# Patient Record
Sex: Female | Born: 1950
Health system: Southern US, Community
[De-identification: ages and names within clinical notes are randomized; demographics above are authoritative.]

## PROBLEM LIST (undated history)

## (undated) DIAGNOSIS — Z9889 Other specified postprocedural states: Secondary | ICD-10-CM

## (undated) DIAGNOSIS — E785 Hyperlipidemia, unspecified: Secondary | ICD-10-CM

## (undated) DIAGNOSIS — R112 Nausea with vomiting, unspecified: Secondary | ICD-10-CM

## (undated) DIAGNOSIS — K589 Irritable bowel syndrome without diarrhea: Secondary | ICD-10-CM

## (undated) DIAGNOSIS — T7840XA Allergy, unspecified, initial encounter: Secondary | ICD-10-CM

## (undated) DIAGNOSIS — I10 Essential (primary) hypertension: Secondary | ICD-10-CM

## (undated) DIAGNOSIS — M1990 Unspecified osteoarthritis, unspecified site: Secondary | ICD-10-CM

## (undated) DIAGNOSIS — F419 Anxiety disorder, unspecified: Secondary | ICD-10-CM

## (undated) DIAGNOSIS — M8580 Other specified disorders of bone density and structure, unspecified site: Secondary | ICD-10-CM

## (undated) DIAGNOSIS — F32A Depression, unspecified: Secondary | ICD-10-CM

## (undated) HISTORY — DX: Anxiety disorder, unspecified: F41.9

## (undated) HISTORY — DX: Essential (primary) hypertension: I10

## (undated) HISTORY — DX: Unspecified osteoarthritis, unspecified site: M19.90

## (undated) HISTORY — DX: Other specified disorders of bone density and structure, unspecified site: M85.80

## (undated) HISTORY — PX: HAND SURGERY: SHX662

## (undated) HISTORY — DX: Other specified postprocedural states: Z98.890

## (undated) HISTORY — DX: Hyperlipidemia, unspecified: E78.5

## (undated) HISTORY — PX: POLYPECTOMY: SHX149

## (undated) HISTORY — PX: COLONOSCOPY: SHX174

## (undated) HISTORY — DX: Irritable bowel syndrome, unspecified: K58.9

## (undated) HISTORY — DX: Nausea with vomiting, unspecified: R11.2

## (undated) HISTORY — DX: Depression, unspecified: F32.A

## (undated) HISTORY — PX: TONSILLECTOMY: SUR1361

## (undated) HISTORY — DX: Allergy, unspecified, initial encounter: T78.40XA

---

## 2000-01-15 ENCOUNTER — Encounter: Payer: Self-pay | Admitting: Obstetrics and Gynecology

## 2000-01-15 ENCOUNTER — Encounter: Admission: RE | Admit: 2000-01-15 | Discharge: 2000-01-15 | Payer: Self-pay | Admitting: Obstetrics and Gynecology

## 2001-09-30 ENCOUNTER — Encounter: Payer: Self-pay | Admitting: Obstetrics and Gynecology

## 2001-09-30 ENCOUNTER — Encounter: Admission: RE | Admit: 2001-09-30 | Discharge: 2001-09-30 | Payer: Self-pay | Admitting: Obstetrics and Gynecology

## 2002-11-11 ENCOUNTER — Encounter: Admission: RE | Admit: 2002-11-11 | Discharge: 2002-11-11 | Payer: Self-pay | Admitting: Obstetrics and Gynecology

## 2002-11-11 ENCOUNTER — Encounter: Payer: Self-pay | Admitting: Obstetrics and Gynecology

## 2004-04-05 ENCOUNTER — Encounter: Admission: RE | Admit: 2004-04-05 | Discharge: 2004-04-05 | Payer: Self-pay | Admitting: Obstetrics and Gynecology

## 2011-09-08 ENCOUNTER — Other Ambulatory Visit: Payer: Self-pay | Admitting: Obstetrics and Gynecology

## 2011-09-08 ENCOUNTER — Ambulatory Visit (HOSPITAL_BASED_OUTPATIENT_CLINIC_OR_DEPARTMENT_OTHER)
Admission: RE | Admit: 2011-09-08 | Discharge: 2011-09-08 | Disposition: A | Discharge status: Home / Self Care | Payer: BC Managed Care – PPO | Source: Ambulatory Visit | Attending: Obstetrics and Gynecology | Admitting: Obstetrics and Gynecology

## 2011-09-08 DIAGNOSIS — N950 Postmenopausal bleeding: Secondary | ICD-10-CM | POA: Insufficient documentation

## 2011-09-08 DIAGNOSIS — Z01810 Encounter for preprocedural cardiovascular examination: Secondary | ICD-10-CM | POA: Insufficient documentation

## 2011-09-08 DIAGNOSIS — N840 Polyp of corpus uteri: Secondary | ICD-10-CM | POA: Insufficient documentation

## 2011-09-08 DIAGNOSIS — Z01812 Encounter for preprocedural laboratory examination: Secondary | ICD-10-CM | POA: Insufficient documentation

## 2011-09-08 LAB — POCT HEMOGLOBIN-HEMACUE: Hemoglobin: 13.3 g/dL (ref 12.0–15.0)

## 2011-09-28 NOTE — Op Note (Signed)
  NAMEMAEVIS, Sherry Monroe             ACCOUNT NO.:  [id number]  MEDICAL RECORD NO.:  [id number]  LOCATION:                                 FACILITY:  PHYSICIAN:  Zelphia Cairo, MD    DATE OF BIRTH:  Feb 26, 1951  DATE OF PROCEDURE:  09/08/2011 DATE OF DISCHARGE:                              OPERATIVE REPORT   PREOPERATIVE DIAGNOSES: 1. Postmenopausal bleeding. 2. Endometrial polyp.  POSTOPERATIVE DIAGNOSES: 1. Postmenopausal bleeding. 2. Endometrial polyp, pathology pending.  PROCEDURE: 1. Hysteroscopy. 2. Dilation and curettage.  SURGEON:  Zelphia Cairo, MD  ANESTHESIA:  General.  FINDINGS:  Endometrial polyps.  SPECIMEN:  Endometrial curettings with polyps to pathology.  FLUID DEFICIT:  Less than 100 cc.  BLOOD LOSS:  Minimal.  COMPLICATIONS:  None.  CONDITION:  Stable to recovery room.  PROCEDURE IN DETAIL:  [name] was taken to the operating room.  After informed consent was obtained, she was given general anesthesia, placed in the dorsal lithotomy position using Allen stirrups.  She was prepped and draped in sterile fashion.  Bivalve speculum was placed in the vagina and 1 cc of 1% lidocaine with epi was injected at 12 o'clock of the cervix.  A single-tooth tenaculum was attached to the anterior lip of the cervix.  Another 5 cc of 1% lidocaine was then injected into the cervix to provide local anesthesia.  The cervix was then easily dilated using Pratt dilators and the diagnostic hysteroscope was inserted into the endometrial cavity.  Endometrial polyps were identified. Hysteroscope was removed and a gentle curetting was performed.  Tissue was placed on Telfa and passed off to be sent to pathology.  The hysteroscope was reinserted into the cavity; however, visualization was very poor at this point because of blood and clots that had been stirred up from the curettage.  The hysteroscope was removed.  Tenaculum was removed.  The cervix was noted to be  hemostatic.  The speculum was removed.  The patient was taken to the recovery room.     Zelphia Cairo, MD     GA/MEDQ  D:  09/08/2011  T:  09/08/2011  Job:  [id number]  Electronically Signed by Zelphia Cairo MD on 09/28/2011 08:22:48 AM

## 2011-12-30 ENCOUNTER — Other Ambulatory Visit: Payer: Self-pay | Admitting: Obstetrics and Gynecology

## 2011-12-30 DIAGNOSIS — R928 Other abnormal and inconclusive findings on diagnostic imaging of breast: Secondary | ICD-10-CM

## 2012-01-02 ENCOUNTER — Ambulatory Visit
Admission: RE | Admit: 2012-01-02 | Discharge: 2012-01-02 | Disposition: A | Discharge status: Home / Self Care | Payer: BC Managed Care – PPO | Source: Ambulatory Visit | Attending: Obstetrics and Gynecology | Admitting: Obstetrics and Gynecology

## 2012-01-02 DIAGNOSIS — R928 Other abnormal and inconclusive findings on diagnostic imaging of breast: Secondary | ICD-10-CM

## 2012-12-15 ENCOUNTER — Other Ambulatory Visit: Payer: Self-pay | Admitting: Obstetrics and Gynecology

## 2012-12-15 DIAGNOSIS — Z1231 Encounter for screening mammogram for malignant neoplasm of breast: Secondary | ICD-10-CM

## 2013-01-18 ENCOUNTER — Ambulatory Visit
Admission: RE | Admit: 2013-01-18 | Discharge: 2013-01-18 | Disposition: A | Discharge status: Home / Self Care | Payer: 59 | Source: Ambulatory Visit | Attending: Obstetrics and Gynecology | Admitting: Obstetrics and Gynecology

## 2013-01-18 DIAGNOSIS — Z1231 Encounter for screening mammogram for malignant neoplasm of breast: Secondary | ICD-10-CM

## 2013-12-15 ENCOUNTER — Ambulatory Visit (INDEPENDENT_AMBULATORY_CARE_PROVIDER_SITE_OTHER): Payer: BC Managed Care – PPO | Admitting: Surgery

## 2013-12-15 ENCOUNTER — Encounter (INDEPENDENT_AMBULATORY_CARE_PROVIDER_SITE_OTHER): Payer: Self-pay | Admitting: Surgery

## 2013-12-15 VITALS — BP 120/78 | HR 79 | Temp 98.1°F | Resp 15 | Ht 67.0 in | Wt 165.6 lb

## 2013-12-15 DIAGNOSIS — L723 Sebaceous cyst: Secondary | ICD-10-CM

## 2013-12-15 NOTE — Progress Notes (Signed)
   Re:   Sherry Monroe DOB:   1951-07-22 MRN:   [id number]  ASSESSMENT AND PLAN: 1.  Sebaceous cyst, back of neck.  2.0 cm  I gave the patient literature on sebacous cysts.  I discussed continued observation vs excision.  Because of her history of an infection in this cyst, I lean towards excision, but I would leave the decision.  But at this time, she wants to observe it.  She knows that if it enlarges or causes trouble, she will be back in touch with [place].  2.  Hypertension 3.  Anxiety 4.  On hormone replacement.  Chief Complaint  Patient presents with  . sebaceous cyst on neck   REFERRING PHYSICIAN: Mathews Argyle, MD  HISTORY OF PRESENT ILLNESS: Sherry Monroe is a 63 y.o. (DOB: 01/26/1951)  white  female whose primary care physician is Mathews Argyle, MD and comes to me today for a sebaceous cyst of the back of her neck.  The patient had noticed this mass at the back of her neck for about 18 to 24 months.. Then in November/December 2014 the area turned red and inflamed. Dr. Lajean Manes put her on antibiotics and the inflammation resolved. Now she can hardly feel the mass.  In fact, in some ways to her, seems smaller after the antibiotics than before the infection. She has come here to discuss possible excision of this mass.   Past Medical History  Diagnosis Date  . Hypertension      Past Surgical History  Procedure Laterality Date  . Hand surgery      Current Outpatient Prescriptions  Medication Sig Dispense Refill  . estradiol (ESTRACE) 0.5 MG tablet Take 0.5 mg by mouth daily.      Marland Kitchen HYDRALAZINE-HCTZ PO Take by mouth.      Marland Kitchen lisinopril (PRINIVIL,ZESTRIL) 10 MG tablet Take 10 mg by mouth daily.      . norethindrone (AYGESTIN) 5 MG tablet Take 0.5 mg by mouth daily.      Marland Kitchen PARoxetine (PAXIL) 20 MG tablet Take 20 mg by mouth daily.       No current facility-administered medications for this visit.    Allergies not on file  REVIEW OF SYSTEMS: Skin:   No history of rash.  No history of abnormal moles. Infection:  No history of hepatitis or HIV.  No history of MRSA.  SOCIAL and FAMILY HISTORY: Married. 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  PHYSICAL EXAM: BP 120/78  Pulse 79  Temp(Src) 98.1 F (36.7 C) (Oral)  Resp 15  Ht 5\' 7"  (1.702 m)  Wt 165 lb 9.6 oz (75.116 kg)  BMI 25.93 kg/m2  General: WN WF who is alert and generally healthy appearing.  HEENT: Normal. Pupils equal. Neck: Supple. No mass.  No thyroid mass.  2.0 cm sebaceous cyst of the back of the neck.  Not inflamed.  DATA REVIEWED: Notes from Dr. Felipa Eth.  Alphonsa Overall, MD,  [facility], [address]  [place], [place]    [place] Phone:  [phone] FAX:  [phone]

## 2014-01-12 ENCOUNTER — Other Ambulatory Visit: Payer: Self-pay

## 2014-01-12 DIAGNOSIS — Z1231 Encounter for screening mammogram for malignant neoplasm of breast: Secondary | ICD-10-CM

## 2014-01-30 ENCOUNTER — Ambulatory Visit
Admission: RE | Admit: 2014-01-30 | Discharge: 2014-01-30 | Disposition: A | Discharge status: Home / Self Care | Payer: Self-pay | Source: Ambulatory Visit

## 2014-01-30 DIAGNOSIS — Z1231 Encounter for screening mammogram for malignant neoplasm of breast: Secondary | ICD-10-CM

## 2015-01-25 ENCOUNTER — Other Ambulatory Visit: Payer: Self-pay

## 2015-01-25 DIAGNOSIS — Z1231 Encounter for screening mammogram for malignant neoplasm of breast: Secondary | ICD-10-CM

## 2015-02-01 ENCOUNTER — Ambulatory Visit: Payer: Self-pay

## 2015-02-21 ENCOUNTER — Ambulatory Visit
Admission: RE | Admit: 2015-02-21 | Discharge: 2015-02-21 | Disposition: A | Discharge status: Home / Self Care | Payer: BLUE CROSS/BLUE SHIELD | Source: Ambulatory Visit

## 2015-02-21 DIAGNOSIS — Z1231 Encounter for screening mammogram for malignant neoplasm of breast: Secondary | ICD-10-CM

## 2015-03-06 ENCOUNTER — Other Ambulatory Visit: Payer: Self-pay | Admitting: Obstetrics and Gynecology

## 2015-03-08 LAB — CYTOLOGY - PAP

## 2016-02-06 ENCOUNTER — Other Ambulatory Visit: Payer: Self-pay

## 2016-02-06 DIAGNOSIS — Z1231 Encounter for screening mammogram for malignant neoplasm of breast: Secondary | ICD-10-CM

## 2016-02-27 ENCOUNTER — Ambulatory Visit
Admission: RE | Admit: 2016-02-27 | Discharge: 2016-02-27 | Disposition: A | Discharge status: Home / Self Care | Payer: Commercial Managed Care - HMO | Source: Ambulatory Visit

## 2016-02-27 DIAGNOSIS — Z1231 Encounter for screening mammogram for malignant neoplasm of breast: Secondary | ICD-10-CM

## 2017-02-03 ENCOUNTER — Other Ambulatory Visit: Payer: Self-pay | Admitting: Geriatric Medicine

## 2017-02-03 DIAGNOSIS — Z1231 Encounter for screening mammogram for malignant neoplasm of breast: Secondary | ICD-10-CM

## 2017-03-02 ENCOUNTER — Ambulatory Visit
Admission: RE | Admit: 2017-03-02 | Discharge: 2017-03-02 | Disposition: A | Discharge status: Home / Self Care | Payer: 59 | Source: Ambulatory Visit | Attending: Geriatric Medicine | Admitting: Geriatric Medicine

## 2017-03-02 DIAGNOSIS — Z1231 Encounter for screening mammogram for malignant neoplasm of breast: Secondary | ICD-10-CM

## 2017-03-04 ENCOUNTER — Other Ambulatory Visit: Payer: Self-pay | Admitting: Geriatric Medicine

## 2017-03-04 DIAGNOSIS — R928 Other abnormal and inconclusive findings on diagnostic imaging of breast: Secondary | ICD-10-CM

## 2017-03-06 ENCOUNTER — Ambulatory Visit
Admission: RE | Admit: 2017-03-06 | Discharge: 2017-03-06 | Disposition: A | Discharge status: Home / Self Care | Payer: 59 | Source: Ambulatory Visit | Attending: Geriatric Medicine | Admitting: Geriatric Medicine

## 2017-03-06 DIAGNOSIS — R928 Other abnormal and inconclusive findings on diagnostic imaging of breast: Secondary | ICD-10-CM

## 2017-04-10 ENCOUNTER — Other Ambulatory Visit: Payer: Self-pay | Admitting: Internal Medicine

## 2017-04-10 ENCOUNTER — Ambulatory Visit
Admission: RE | Admit: 2017-04-10 | Discharge: 2017-04-10 | Disposition: A | Discharge status: Home / Self Care | Payer: 59 | Source: Ambulatory Visit | Attending: Internal Medicine | Admitting: Internal Medicine

## 2017-04-10 DIAGNOSIS — M7022 Olecranon bursitis, left elbow: Secondary | ICD-10-CM

## 2018-01-22 DIAGNOSIS — N393 Stress incontinence (female) (male): Secondary | ICD-10-CM | POA: Diagnosis not present

## 2018-01-22 DIAGNOSIS — I10 Essential (primary) hypertension: Secondary | ICD-10-CM | POA: Diagnosis not present

## 2018-01-22 DIAGNOSIS — Z79899 Other long term (current) drug therapy: Secondary | ICD-10-CM | POA: Diagnosis not present

## 2018-01-22 DIAGNOSIS — Z23 Encounter for immunization: Secondary | ICD-10-CM | POA: Diagnosis not present

## 2018-01-22 DIAGNOSIS — E7800 Pure hypercholesterolemia, unspecified: Secondary | ICD-10-CM | POA: Diagnosis not present

## 2018-01-22 DIAGNOSIS — Z0000 Encounter for general adult medical examination without abnormal findings: Secondary | ICD-10-CM | POA: Diagnosis not present

## 2018-01-22 DIAGNOSIS — F325 Major depressive disorder, single episode, in full remission: Secondary | ICD-10-CM | POA: Diagnosis not present

## 2018-03-03 ENCOUNTER — Other Ambulatory Visit: Payer: Self-pay | Admitting: Geriatric Medicine

## 2018-03-03 DIAGNOSIS — Z1231 Encounter for screening mammogram for malignant neoplasm of breast: Secondary | ICD-10-CM

## 2018-03-23 ENCOUNTER — Ambulatory Visit
Admission: RE | Admit: 2018-03-23 | Discharge: 2018-03-23 | Disposition: A | Discharge status: Home / Self Care | Payer: PPO | Source: Ambulatory Visit | Attending: Geriatric Medicine | Admitting: Geriatric Medicine

## 2018-03-23 DIAGNOSIS — Z1231 Encounter for screening mammogram for malignant neoplasm of breast: Secondary | ICD-10-CM

## 2018-04-01 DIAGNOSIS — Z6826 Body mass index (BMI) 26.0-26.9, adult: Secondary | ICD-10-CM | POA: Diagnosis not present

## 2018-04-01 DIAGNOSIS — Z01419 Encounter for gynecological examination (general) (routine) without abnormal findings: Secondary | ICD-10-CM | POA: Diagnosis not present

## 2018-04-08 DIAGNOSIS — H2513 Age-related nuclear cataract, bilateral: Secondary | ICD-10-CM | POA: Diagnosis not present

## 2018-07-21 DIAGNOSIS — Z79899 Other long term (current) drug therapy: Secondary | ICD-10-CM | POA: Diagnosis not present

## 2018-07-21 DIAGNOSIS — I10 Essential (primary) hypertension: Secondary | ICD-10-CM | POA: Diagnosis not present

## 2019-02-07 DIAGNOSIS — Z1389 Encounter for screening for other disorder: Secondary | ICD-10-CM | POA: Diagnosis not present

## 2019-02-07 DIAGNOSIS — Z0000 Encounter for general adult medical examination without abnormal findings: Secondary | ICD-10-CM | POA: Diagnosis not present

## 2019-02-07 DIAGNOSIS — F325 Major depressive disorder, single episode, in full remission: Secondary | ICD-10-CM | POA: Diagnosis not present

## 2019-02-07 DIAGNOSIS — I10 Essential (primary) hypertension: Secondary | ICD-10-CM | POA: Diagnosis not present

## 2019-02-07 DIAGNOSIS — Z79899 Other long term (current) drug therapy: Secondary | ICD-10-CM | POA: Diagnosis not present

## 2019-02-07 DIAGNOSIS — E7800 Pure hypercholesterolemia, unspecified: Secondary | ICD-10-CM | POA: Diagnosis not present

## 2019-02-11 ENCOUNTER — Other Ambulatory Visit: Payer: Self-pay | Admitting: Geriatric Medicine

## 2019-02-11 DIAGNOSIS — Z1231 Encounter for screening mammogram for malignant neoplasm of breast: Secondary | ICD-10-CM

## 2019-03-25 ENCOUNTER — Ambulatory Visit: Payer: PPO

## 2019-05-16 ENCOUNTER — Ambulatory Visit
Admission: RE | Admit: 2019-05-16 | Discharge: 2019-05-16 | Disposition: A | Discharge status: Home / Self Care | Payer: PPO | Source: Ambulatory Visit | Attending: Geriatric Medicine | Admitting: Geriatric Medicine

## 2019-05-16 ENCOUNTER — Other Ambulatory Visit: Payer: Self-pay

## 2019-05-16 DIAGNOSIS — Z1231 Encounter for screening mammogram for malignant neoplasm of breast: Secondary | ICD-10-CM

## 2019-05-31 DIAGNOSIS — M19041 Primary osteoarthritis, right hand: Secondary | ICD-10-CM | POA: Diagnosis not present

## 2019-05-31 DIAGNOSIS — M19042 Primary osteoarthritis, left hand: Secondary | ICD-10-CM | POA: Diagnosis not present

## 2019-05-31 DIAGNOSIS — I10 Essential (primary) hypertension: Secondary | ICD-10-CM | POA: Diagnosis not present

## 2019-05-31 DIAGNOSIS — F325 Major depressive disorder, single episode, in full remission: Secondary | ICD-10-CM | POA: Diagnosis not present

## 2019-05-31 DIAGNOSIS — M810 Age-related osteoporosis without current pathological fracture: Secondary | ICD-10-CM | POA: Diagnosis not present

## 2019-07-07 DIAGNOSIS — Z01419 Encounter for gynecological examination (general) (routine) without abnormal findings: Secondary | ICD-10-CM | POA: Diagnosis not present

## 2019-07-07 DIAGNOSIS — Z124 Encounter for screening for malignant neoplasm of cervix: Secondary | ICD-10-CM | POA: Diagnosis not present

## 2019-07-07 DIAGNOSIS — I10 Essential (primary) hypertension: Secondary | ICD-10-CM | POA: Insufficient documentation

## 2019-07-07 DIAGNOSIS — M8588 Other specified disorders of bone density and structure, other site: Secondary | ICD-10-CM | POA: Diagnosis not present

## 2019-07-07 DIAGNOSIS — N958 Other specified menopausal and perimenopausal disorders: Secondary | ICD-10-CM | POA: Diagnosis not present

## 2019-07-07 DIAGNOSIS — E7800 Pure hypercholesterolemia, unspecified: Secondary | ICD-10-CM | POA: Insufficient documentation

## 2019-07-07 DIAGNOSIS — Z6823 Body mass index (BMI) 23.0-23.9, adult: Secondary | ICD-10-CM | POA: Diagnosis not present

## 2019-07-12 DIAGNOSIS — I10 Essential (primary) hypertension: Secondary | ICD-10-CM | POA: Diagnosis not present

## 2019-07-12 DIAGNOSIS — F325 Major depressive disorder, single episode, in full remission: Secondary | ICD-10-CM | POA: Diagnosis not present

## 2019-07-12 DIAGNOSIS — M19041 Primary osteoarthritis, right hand: Secondary | ICD-10-CM | POA: Diagnosis not present

## 2019-07-12 DIAGNOSIS — M810 Age-related osteoporosis without current pathological fracture: Secondary | ICD-10-CM | POA: Diagnosis not present

## 2019-07-12 DIAGNOSIS — M19042 Primary osteoarthritis, left hand: Secondary | ICD-10-CM | POA: Diagnosis not present

## 2019-08-16 DIAGNOSIS — Z23 Encounter for immunization: Secondary | ICD-10-CM | POA: Diagnosis not present

## 2019-08-16 DIAGNOSIS — B372 Candidiasis of skin and nail: Secondary | ICD-10-CM | POA: Diagnosis not present

## 2019-08-16 DIAGNOSIS — F325 Major depressive disorder, single episode, in full remission: Secondary | ICD-10-CM | POA: Diagnosis not present

## 2019-08-16 DIAGNOSIS — I10 Essential (primary) hypertension: Secondary | ICD-10-CM | POA: Diagnosis not present

## 2019-10-19 DIAGNOSIS — L821 Other seborrheic keratosis: Secondary | ICD-10-CM | POA: Diagnosis not present

## 2019-10-19 DIAGNOSIS — I10 Essential (primary) hypertension: Secondary | ICD-10-CM | POA: Diagnosis not present

## 2019-11-02 DIAGNOSIS — M19041 Primary osteoarthritis, right hand: Secondary | ICD-10-CM | POA: Diagnosis not present

## 2019-11-02 DIAGNOSIS — F325 Major depressive disorder, single episode, in full remission: Secondary | ICD-10-CM | POA: Diagnosis not present

## 2019-11-02 DIAGNOSIS — M19042 Primary osteoarthritis, left hand: Secondary | ICD-10-CM | POA: Diagnosis not present

## 2019-11-02 DIAGNOSIS — I10 Essential (primary) hypertension: Secondary | ICD-10-CM | POA: Diagnosis not present

## 2019-11-02 DIAGNOSIS — E7800 Pure hypercholesterolemia, unspecified: Secondary | ICD-10-CM | POA: Diagnosis not present

## 2019-11-02 DIAGNOSIS — M810 Age-related osteoporosis without current pathological fracture: Secondary | ICD-10-CM | POA: Diagnosis not present

## 2019-12-28 ENCOUNTER — Ambulatory Visit: Payer: PPO

## 2020-01-06 ENCOUNTER — Ambulatory Visit: Payer: PPO | Attending: Internal Medicine

## 2020-01-06 DIAGNOSIS — Z23 Encounter for immunization: Secondary | ICD-10-CM | POA: Insufficient documentation

## 2020-01-06 NOTE — Progress Notes (Signed)
   Covid-19 Vaccination Clinic  Name:  Sherry Monroe    MRN: [id number] DOB: 1951/11/30  01/06/2020  [name] was observed post Covid-19 immunization for 15 minutes without incidence. She was provided with Vaccine Information Sheet and instruction to access the V-Safe system.   [name] was instructed to call 911 with any severe reactions post vaccine: Marland Kitchen Difficulty breathing  . Swelling of your face and throat  . A fast heartbeat  . A bad rash all over your body  . Dizziness and weakness    Immunizations Administered    Name Date Dose VIS Date Route   Pfizer COVID-19 Vaccine 01/06/2020  8:22 AM 0.3 mL 11/11/2019 Intramuscular   Manufacturer: [place]   Lot: [id number]   [place]: [id number]

## 2020-01-18 ENCOUNTER — Ambulatory Visit: Payer: PPO

## 2020-01-27 DIAGNOSIS — M19041 Primary osteoarthritis, right hand: Secondary | ICD-10-CM | POA: Diagnosis not present

## 2020-01-27 DIAGNOSIS — F325 Major depressive disorder, single episode, in full remission: Secondary | ICD-10-CM | POA: Diagnosis not present

## 2020-01-27 DIAGNOSIS — M810 Age-related osteoporosis without current pathological fracture: Secondary | ICD-10-CM | POA: Diagnosis not present

## 2020-01-27 DIAGNOSIS — I10 Essential (primary) hypertension: Secondary | ICD-10-CM | POA: Diagnosis not present

## 2020-01-27 DIAGNOSIS — M19042 Primary osteoarthritis, left hand: Secondary | ICD-10-CM | POA: Diagnosis not present

## 2020-01-27 DIAGNOSIS — E7800 Pure hypercholesterolemia, unspecified: Secondary | ICD-10-CM | POA: Diagnosis not present

## 2020-02-01 ENCOUNTER — Ambulatory Visit: Payer: PPO | Attending: Internal Medicine

## 2020-02-01 DIAGNOSIS — Z23 Encounter for immunization: Secondary | ICD-10-CM | POA: Insufficient documentation

## 2020-02-01 NOTE — Progress Notes (Signed)
   Covid-19 Vaccination Clinic  Name:  Sherry Monroe    MRN: [id number] DOB: [birth year]  02/01/2020  [name] was observed post Covid-19 immunization for 15 minutes without incident. She was provided with Vaccine Information Sheet and instruction to access the V-Safe system.   [name] was instructed to call 911 with any severe reactions post vaccine: Marland Kitchen Difficulty breathing  . Swelling of face and throat  . A fast heartbeat  . A bad rash all over body  . Dizziness and weakness   Immunizations Administered    Name Date Dose VIS Date Route   Pfizer COVID-19 Vaccine 02/01/2020  8:15 AM 0.3 mL 11/11/2019 Intramuscular   Manufacturer: [place]   Lot: [id number]   [place]: [id number]

## 2020-02-28 DIAGNOSIS — L989 Disorder of the skin and subcutaneous tissue, unspecified: Secondary | ICD-10-CM | POA: Diagnosis not present

## 2020-02-28 DIAGNOSIS — M810 Age-related osteoporosis without current pathological fracture: Secondary | ICD-10-CM | POA: Diagnosis not present

## 2020-02-28 DIAGNOSIS — Z1389 Encounter for screening for other disorder: Secondary | ICD-10-CM | POA: Diagnosis not present

## 2020-02-28 DIAGNOSIS — F3342 Major depressive disorder, recurrent, in full remission: Secondary | ICD-10-CM | POA: Diagnosis not present

## 2020-02-28 DIAGNOSIS — Z79899 Other long term (current) drug therapy: Secondary | ICD-10-CM | POA: Diagnosis not present

## 2020-02-28 DIAGNOSIS — I10 Essential (primary) hypertension: Secondary | ICD-10-CM | POA: Diagnosis not present

## 2020-02-28 DIAGNOSIS — F325 Major depressive disorder, single episode, in full remission: Secondary | ICD-10-CM | POA: Diagnosis not present

## 2020-02-28 DIAGNOSIS — L723 Sebaceous cyst: Secondary | ICD-10-CM | POA: Diagnosis not present

## 2020-02-28 DIAGNOSIS — Z0000 Encounter for general adult medical examination without abnormal findings: Secondary | ICD-10-CM | POA: Diagnosis not present

## 2020-02-28 DIAGNOSIS — E7800 Pure hypercholesterolemia, unspecified: Secondary | ICD-10-CM | POA: Diagnosis not present

## 2020-04-04 ENCOUNTER — Encounter: Payer: Self-pay | Admitting: Gastroenterology

## 2020-05-01 DIAGNOSIS — Z1331 Encounter for screening for depression: Secondary | ICD-10-CM | POA: Diagnosis not present

## 2020-05-01 DIAGNOSIS — F3340 Major depressive disorder, recurrent, in remission, unspecified: Secondary | ICD-10-CM | POA: Diagnosis not present

## 2020-05-01 DIAGNOSIS — E7849 Other hyperlipidemia: Secondary | ICD-10-CM | POA: Diagnosis not present

## 2020-05-01 DIAGNOSIS — I10 Essential (primary) hypertension: Secondary | ICD-10-CM | POA: Diagnosis not present

## 2020-05-09 ENCOUNTER — Other Ambulatory Visit: Payer: Self-pay

## 2020-05-09 ENCOUNTER — Ambulatory Visit (AMBULATORY_SURGERY_CENTER): Payer: Self-pay | Admitting: *Deleted

## 2020-05-09 VITALS — Ht 66.5 in | Wt 147.0 lb

## 2020-05-09 DIAGNOSIS — Z1211 Encounter for screening for malignant neoplasm of colon: Secondary | ICD-10-CM

## 2020-05-09 MED ORDER — SUTAB 1479-225-188 MG PO TABS: 24.0000 | ORAL_TABLET | ORAL | 0 refills | Status: DC

## 2020-05-09 NOTE — Progress Notes (Signed)
02-01-20 comp 2nd covid vaccine  No egg or soy allergy known to patient  No issues with past sedation with any surgeries  or procedures, no intubation problems  No diet pills per patient No home 02 use per patient  No blood thinners per patient   Pt denies issues with constipation - she states she has daily soft regular BM's- she told me at her last colon 10 + yrs ago was told she did not have a great prep - she said she had Golytely last colon and maybe didn't complete the prep - instructed pt to use an OTC stool softener or 1 capful of Miralax daily IF she needs to prior to her Colon 6-23  No A fib or A flutter  EMMI video sent to pt's e mail   Due to the COVID-19 pandemic we are asking patients to follow these guidelines. Please only bring one care partner. Please be aware that your care partner may wait in the car in the parking lot or if they feel like they will be too hot to wait in the car, they may wait in the lobby on the 4th floor. All care partners are required to wear a mask the entire time (we do not have any that we can provide them), they need to practice social distancing, and we will do a Covid check for all patient's and care partners when you arrive. Also we will check their temperature and your temperature. If the care partner waits in their car they need to stay in the parking lot the entire time and we will call them on their cell phone when the patient is ready for discharge so they can bring the car to the front of the building. Also all patient's will need to wear a mask into building.

## 2020-05-16 ENCOUNTER — Encounter: Payer: Self-pay | Admitting: Gastroenterology

## 2020-05-23 ENCOUNTER — Encounter: Payer: Self-pay | Admitting: Gastroenterology

## 2020-05-23 ENCOUNTER — Ambulatory Visit (AMBULATORY_SURGERY_CENTER): Payer: PPO | Admitting: Gastroenterology

## 2020-05-23 ENCOUNTER — Other Ambulatory Visit: Payer: Self-pay

## 2020-05-23 VITALS — BP 102/58 | HR 52 | Temp 97.4°F | Resp 21 | Ht 67.0 in | Wt 147.0 lb

## 2020-05-23 DIAGNOSIS — Z1211 Encounter for screening for malignant neoplasm of colon: Secondary | ICD-10-CM

## 2020-05-23 DIAGNOSIS — K635 Polyp of colon: Secondary | ICD-10-CM

## 2020-05-23 DIAGNOSIS — D122 Benign neoplasm of ascending colon: Secondary | ICD-10-CM

## 2020-05-23 MED ORDER — SODIUM CHLORIDE 0.9 % IV SOLN: 500.0000 mL | Freq: Once | INTRAVENOUS | Status: DC

## 2020-05-23 NOTE — Patient Instructions (Signed)
Information on polyps and hemorrhoids given to you today.  Await pathology results.  Resume previous diet and medications.  YOU HAD AN ENDOSCOPIC PROCEDURE TODAY AT [facility]:   Refer to the procedure report that was given to you for any specific questions about what was found during the examination.  If the procedure report does not answer your questions, please call your gastroenterologist to clarify.  If you requested that your care partner not be given the details of your procedure findings, then the procedure report has been included in a sealed envelope for you to review at your convenience later.  YOU SHOULD EXPECT: Some feelings of bloating in the abdomen. Passage of more gas than usual.  Walking can help get rid of the air that was put into your GI tract during the procedure and reduce the bloating. If you had a lower endoscopy (such as a colonoscopy or flexible sigmoidoscopy) you may notice spotting of blood in your stool or on the toilet paper. If you underwent a bowel prep for your procedure, you may not have a normal bowel movement for a few days.  Please Note:  You might notice some irritation and congestion in your nose or some drainage.  This is from the oxygen used during your procedure.  There is no need for concern and it should clear up in a day or so.  SYMPTOMS TO REPORT IMMEDIATELY:   Following lower endoscopy (colonoscopy or flexible sigmoidoscopy):  Excessive amounts of blood in the stool  Significant tenderness or worsening of abdominal pains  Swelling of the abdomen that is new, acute  Fever of 100F or higher  For urgent or emergent issues, a gastroenterologist can be reached at any hour by calling [phone]. Do not use MyChart messaging for urgent concerns.    DIET:  We do recommend a small meal at first, but then you may proceed to your regular diet.  Drink plenty of fluids but you should avoid alcoholic beverages for 24  hours.  ACTIVITY:  You should plan to take it easy for the rest of today and you should NOT DRIVE or use heavy machinery until tomorrow (because of the sedation medicines used during the test).    FOLLOW UP: Our staff will call the number listed on your records 48-72 hours following your procedure to check on you and address any questions or concerns that you may have regarding the information given to you following your procedure. If we do not reach you, we will leave a message.  We will attempt to reach you two times.  During this call, we will ask if you have developed any symptoms of COVID 19. If you develop any symptoms (ie: fever, flu-like symptoms, shortness of breath, cough etc.) before then, please call [phone].  If you test positive for Covid 19 in the 2 weeks post procedure, please call and report this information to [place].    If any biopsies were taken you will be contacted by phone or by letter within the next 1-3 weeks.  Please call us at [phone] if you have not heard about the biopsies in 3 weeks.    SIGNATURES/CONFIDENTIALITY: You and/or your care partner have signed paperwork which will be entered into your electronic medical record.  These signatures attest to the fact that that the information above on your After Visit Summary has been reviewed and is understood.  Full responsibility of the confidentiality of this discharge information lies with you and/or your  care-partner.

## 2020-05-23 NOTE — Progress Notes (Signed)
A/ox3, pleased with MAC, report to RN 

## 2020-05-23 NOTE — Progress Notes (Signed)
Pt's states no medical or surgical changes since previsit or office visit. 

## 2020-05-23 NOTE — Progress Notes (Signed)
Called to room to assist during endoscopic procedure.  Patient ID and intended procedure confirmed with present staff. Received instructions for my participation in the procedure from the performing physician.  

## 2020-05-23 NOTE — Op Note (Signed)
[facility] Patient Name: Sherry Monroe Procedure Date: 05/23/2020 8:37 AM MRN: [id number] Endoscopist: Thornton Park MD, MD Age: 69 Referring MD:  Date of Birth: March 09, 1951 Gender: Female Account #: [id number] Procedure:                Colonoscopy Indications:              Screening for colorectal malignant neoplasm                           Prior colonoscopy at [facility] 11                            years ago                           No known family history of first degree relatives                            with colon cancer or polyps Medicines:                Monitored Anesthesia Care Procedure:                Pre-Anesthesia Assessment:                           - Prior to the procedure, a History and Physical                            was performed, and patient medications and                            allergies were reviewed. The patient's tolerance of                            previous anesthesia was also reviewed. The risks                            and benefits of the procedure and the sedation                            options and risks were discussed with the patient.                            All questions were answered, and informed consent                            was obtained. Prior Anticoagulants: The patient has                            taken no previous anticoagulant or antiplatelet                            agents. ASA Grade Assessment: II - A patient with  mild systemic disease. After reviewing the risks                            and benefits, the patient was deemed in                            satisfactory condition to undergo the procedure.                           After obtaining informed consent, the colonoscope                            was passed under direct vision. Throughout the                            procedure, the patient's blood pressure, pulse, and                             oxygen saturations were monitored continuously. The                            Colonoscope was introduced through the anus and                            advanced to the the cecum, identified by                            appendiceal orifice and ileocecal valve. A second                            forward view of the right colon was performed. The                            colonoscopy was performed without difficulty. The                            patient tolerated the procedure well. The quality                            of the bowel preparation was good. The terminal                            ileum, ileocecal valve, appendiceal orifice, and                            rectum were photographed. Scope In: 8:48:26 AM Scope Out: 9:05:51 AM Scope Withdrawal Time: 0 hours 13 minutes 59 seconds  Total Procedure Duration: 0 hours 17 minutes 25 seconds  Findings:                 The perianal and digital rectal examinations were                            normal.  A 2 mm polyp was found in the ascending colon. The                            polyp was flat. The polyp was removed with a cold                            snare. Resection and retrieval were complete.                            Estimated blood loss was minimal.                           Non-bleeding internal hemorrhoids were found.                           The exam was otherwise without abnormality on                            direct and retroflexion views. Complications:            No immediate complications. Estimated blood loss:                            Minimal. Estimated Blood Loss:     Estimated blood loss was minimal. Impression:               - One 2 mm polyp in the ascending colon, removed                            with a cold snare. Resected and retrieved.                           - Non-bleeding internal hemorrhoids.                           - The examination was otherwise normal on direct                             and retroflexion views. Recommendation:           - Patient has a contact number available for                            emergencies. The signs and symptoms of potential                            delayed complications were discussed with the                            patient. Return to normal activities tomorrow.                            Written discharge instructions were provided to the  patient.                           - Resume previous diet.                           - Continue present medications.                           - Await pathology results.                           - Repeat colonoscopy date to be determined after                            pending pathology results are reviewed for                            surveillance.                           - Emerging evidence supports eating a diet of                            fruits, vegetables, grains, calcium, and yogurt                            while reducing red meat and alcohol may reduce the                            risk of colon cancer.                           - Thank you for allowing me to be involved in your                            colon cancer prevention. Thornton Park MD, MD 05/23/2020 9:11:31 AM This report has been signed electronically.

## 2020-05-25 ENCOUNTER — Telehealth: Payer: Self-pay

## 2020-05-25 NOTE — Telephone Encounter (Signed)
  Follow up Call-  Call back number 05/23/2020  Post procedure Call Back phone  # [phone]  Permission to leave phone message Yes  Some recent data might be hidden     Patient questions:  Do you have a fever, pain , or abdominal swelling? No. Pain Score  0 *  Have you tolerated food without any problems? Yes.    Have you been able to return to your normal activities? Yes.    Do you have any questions about your discharge instructions: Diet   No. Medications  No. Follow up visit  No.  Do you have questions or concerns about your Care? No.  Actions: * If pain score is 4 or above: No action needed, pain <4.  1. Have you developed a fever since your procedure? No  2.   Have you had an respiratory symptoms (SOB or cough) since your procedure? No  3.   Have you tested positive for COVID 19 since your procedure No  4.   Have you had any family members/close contacts diagnosed with the COVID 19 since your procedure?  No   If yes to any of these questions please route to Joylene John, RN and Erenest Rasher, RN

## 2020-06-05 ENCOUNTER — Encounter: Payer: Self-pay | Admitting: Gastroenterology

## 2020-06-15 ENCOUNTER — Other Ambulatory Visit: Payer: Self-pay | Admitting: Internal Medicine

## 2020-06-15 DIAGNOSIS — Z1231 Encounter for screening mammogram for malignant neoplasm of breast: Secondary | ICD-10-CM

## 2020-07-02 ENCOUNTER — Ambulatory Visit
Admission: RE | Admit: 2020-07-02 | Discharge: 2020-07-02 | Disposition: A | Discharge status: Home / Self Care | Payer: PPO | Source: Ambulatory Visit | Attending: Internal Medicine | Admitting: Internal Medicine

## 2020-07-02 ENCOUNTER — Other Ambulatory Visit: Payer: Self-pay

## 2020-07-02 DIAGNOSIS — Z1231 Encounter for screening mammogram for malignant neoplasm of breast: Secondary | ICD-10-CM | POA: Diagnosis not present

## 2020-07-31 DIAGNOSIS — F3340 Major depressive disorder, recurrent, in remission, unspecified: Secondary | ICD-10-CM | POA: Diagnosis not present

## 2020-07-31 DIAGNOSIS — I10 Essential (primary) hypertension: Secondary | ICD-10-CM | POA: Diagnosis not present

## 2020-07-31 DIAGNOSIS — E785 Hyperlipidemia, unspecified: Secondary | ICD-10-CM | POA: Diagnosis not present

## 2021-02-26 DIAGNOSIS — E785 Hyperlipidemia, unspecified: Secondary | ICD-10-CM | POA: Diagnosis not present

## 2021-03-05 DIAGNOSIS — E785 Hyperlipidemia, unspecified: Secondary | ICD-10-CM | POA: Diagnosis not present

## 2021-03-05 DIAGNOSIS — I10 Essential (primary) hypertension: Secondary | ICD-10-CM | POA: Diagnosis not present

## 2021-03-05 DIAGNOSIS — Z6823 Body mass index (BMI) 23.0-23.9, adult: Secondary | ICD-10-CM | POA: Diagnosis not present

## 2021-03-05 DIAGNOSIS — Z1339 Encounter for screening examination for other mental health and behavioral disorders: Secondary | ICD-10-CM | POA: Diagnosis not present

## 2021-03-05 DIAGNOSIS — Z1331 Encounter for screening for depression: Secondary | ICD-10-CM | POA: Diagnosis not present

## 2021-03-05 DIAGNOSIS — R739 Hyperglycemia, unspecified: Secondary | ICD-10-CM | POA: Diagnosis not present

## 2021-03-05 DIAGNOSIS — F3340 Major depressive disorder, recurrent, in remission, unspecified: Secondary | ICD-10-CM | POA: Diagnosis not present

## 2021-03-05 DIAGNOSIS — R21 Rash and other nonspecific skin eruption: Secondary | ICD-10-CM | POA: Diagnosis not present

## 2021-03-05 DIAGNOSIS — Z0000 Encounter for general adult medical examination without abnormal findings: Secondary | ICD-10-CM | POA: Diagnosis not present

## 2021-05-08 DIAGNOSIS — H5203 Hypermetropia, bilateral: Secondary | ICD-10-CM | POA: Diagnosis not present

## 2021-07-19 DIAGNOSIS — U071 COVID-19: Secondary | ICD-10-CM | POA: Diagnosis not present

## 2021-07-25 DIAGNOSIS — Z20822 Contact with and (suspected) exposure to covid-19: Secondary | ICD-10-CM | POA: Diagnosis not present

## 2021-08-12 ENCOUNTER — Other Ambulatory Visit: Payer: Self-pay | Admitting: Internal Medicine

## 2021-08-12 DIAGNOSIS — Z1231 Encounter for screening mammogram for malignant neoplasm of breast: Secondary | ICD-10-CM

## 2021-09-04 DIAGNOSIS — J029 Acute pharyngitis, unspecified: Secondary | ICD-10-CM | POA: Diagnosis not present

## 2021-09-04 DIAGNOSIS — R0981 Nasal congestion: Secondary | ICD-10-CM | POA: Diagnosis not present

## 2021-09-04 DIAGNOSIS — I10 Essential (primary) hypertension: Secondary | ICD-10-CM | POA: Diagnosis not present

## 2021-09-04 DIAGNOSIS — R509 Fever, unspecified: Secondary | ICD-10-CM | POA: Diagnosis not present

## 2021-09-04 DIAGNOSIS — R051 Acute cough: Secondary | ICD-10-CM | POA: Diagnosis not present

## 2021-09-04 DIAGNOSIS — Z1152 Encounter for screening for COVID-19: Secondary | ICD-10-CM | POA: Diagnosis not present

## 2021-09-04 DIAGNOSIS — J189 Pneumonia, unspecified organism: Secondary | ICD-10-CM | POA: Diagnosis not present

## 2021-09-06 DIAGNOSIS — F3340 Major depressive disorder, recurrent, in remission, unspecified: Secondary | ICD-10-CM | POA: Diagnosis not present

## 2021-09-06 DIAGNOSIS — I10 Essential (primary) hypertension: Secondary | ICD-10-CM | POA: Diagnosis not present

## 2021-09-06 DIAGNOSIS — J189 Pneumonia, unspecified organism: Secondary | ICD-10-CM | POA: Diagnosis not present

## 2021-09-06 DIAGNOSIS — E785 Hyperlipidemia, unspecified: Secondary | ICD-10-CM | POA: Diagnosis not present

## 2021-09-16 ENCOUNTER — Ambulatory Visit
Admission: RE | Admit: 2021-09-16 | Discharge: 2021-09-16 | Disposition: A | Discharge status: Home / Self Care | Payer: PPO | Source: Ambulatory Visit | Attending: Internal Medicine | Admitting: Internal Medicine

## 2021-09-16 ENCOUNTER — Other Ambulatory Visit: Payer: Self-pay

## 2021-09-16 DIAGNOSIS — Z1231 Encounter for screening mammogram for malignant neoplasm of breast: Secondary | ICD-10-CM | POA: Diagnosis not present

## 2021-09-23 DIAGNOSIS — Z23 Encounter for immunization: Secondary | ICD-10-CM | POA: Diagnosis not present

## 2021-09-23 DIAGNOSIS — I10 Essential (primary) hypertension: Secondary | ICD-10-CM | POA: Diagnosis not present

## 2021-10-15 DIAGNOSIS — Z6823 Body mass index (BMI) 23.0-23.9, adult: Secondary | ICD-10-CM | POA: Diagnosis not present

## 2021-10-15 DIAGNOSIS — N958 Other specified menopausal and perimenopausal disorders: Secondary | ICD-10-CM | POA: Diagnosis not present

## 2021-10-15 DIAGNOSIS — R29890 Loss of height: Secondary | ICD-10-CM | POA: Diagnosis not present

## 2021-10-15 DIAGNOSIS — M816 Localized osteoporosis [Lequesne]: Secondary | ICD-10-CM | POA: Diagnosis not present

## 2021-10-15 DIAGNOSIS — Z8262 Family history of osteoporosis: Secondary | ICD-10-CM | POA: Diagnosis not present

## 2021-10-15 DIAGNOSIS — Z124 Encounter for screening for malignant neoplasm of cervix: Secondary | ICD-10-CM | POA: Diagnosis not present

## 2021-11-29 DIAGNOSIS — R059 Cough, unspecified: Secondary | ICD-10-CM | POA: Diagnosis not present

## 2021-11-29 DIAGNOSIS — R519 Headache, unspecified: Secondary | ICD-10-CM | POA: Diagnosis not present

## 2021-11-29 DIAGNOSIS — M791 Myalgia, unspecified site: Secondary | ICD-10-CM | POA: Diagnosis not present

## 2021-11-29 DIAGNOSIS — J069 Acute upper respiratory infection, unspecified: Secondary | ICD-10-CM | POA: Diagnosis not present

## 2021-11-29 DIAGNOSIS — Z1152 Encounter for screening for COVID-19: Secondary | ICD-10-CM | POA: Diagnosis not present

## 2021-11-29 DIAGNOSIS — R509 Fever, unspecified: Secondary | ICD-10-CM | POA: Diagnosis not present

## 2022-02-27 DIAGNOSIS — I10 Essential (primary) hypertension: Secondary | ICD-10-CM | POA: Diagnosis not present

## 2022-02-27 DIAGNOSIS — E785 Hyperlipidemia, unspecified: Secondary | ICD-10-CM | POA: Diagnosis not present

## 2022-03-06 DIAGNOSIS — R739 Hyperglycemia, unspecified: Secondary | ICD-10-CM | POA: Diagnosis not present

## 2022-03-06 DIAGNOSIS — Z0000 Encounter for general adult medical examination without abnormal findings: Secondary | ICD-10-CM | POA: Diagnosis not present

## 2022-03-06 DIAGNOSIS — Z1331 Encounter for screening for depression: Secondary | ICD-10-CM | POA: Diagnosis not present

## 2022-03-06 DIAGNOSIS — F3340 Major depressive disorder, recurrent, in remission, unspecified: Secondary | ICD-10-CM | POA: Diagnosis not present

## 2022-03-06 DIAGNOSIS — F419 Anxiety disorder, unspecified: Secondary | ICD-10-CM | POA: Diagnosis not present

## 2022-03-06 DIAGNOSIS — I10 Essential (primary) hypertension: Secondary | ICD-10-CM | POA: Diagnosis not present

## 2022-03-06 DIAGNOSIS — E785 Hyperlipidemia, unspecified: Secondary | ICD-10-CM | POA: Diagnosis not present

## 2022-03-06 DIAGNOSIS — R69 Illness, unspecified: Secondary | ICD-10-CM | POA: Diagnosis not present

## 2022-03-06 DIAGNOSIS — L304 Erythema intertrigo: Secondary | ICD-10-CM | POA: Diagnosis not present

## 2022-03-06 DIAGNOSIS — Z1339 Encounter for screening examination for other mental health and behavioral disorders: Secondary | ICD-10-CM | POA: Diagnosis not present

## 2022-03-08 IMAGING — MG DIGITAL SCREENING BILAT W/ TOMO W/ CAD
8 series · 8 of 24 positions shown · non-contrast
Comparison: Previous exam(s).

CLINICAL DATA: Screening.

EXAM:
DIGITAL SCREENING BILATERAL MAMMOGRAM WITH TOMO AND CAD

[L MLO synth-2D]
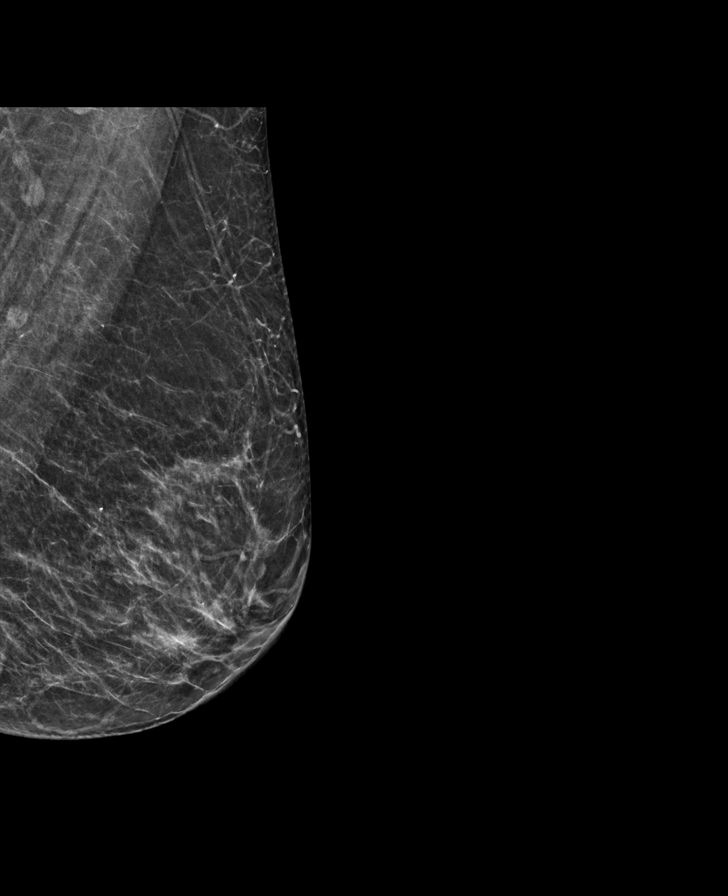

[R CC synth-2D]
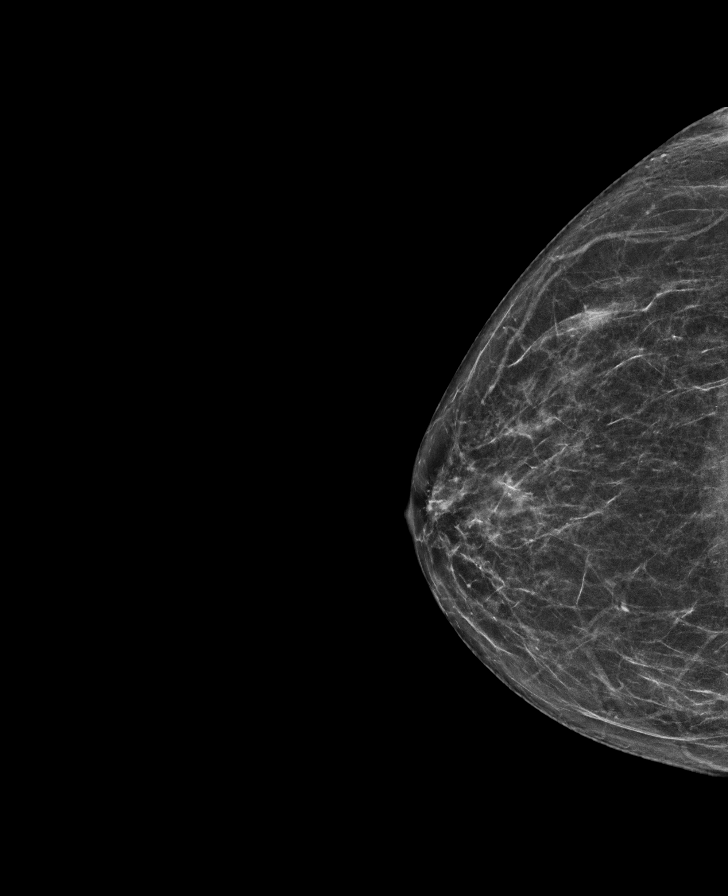

[L CC synth-2D]
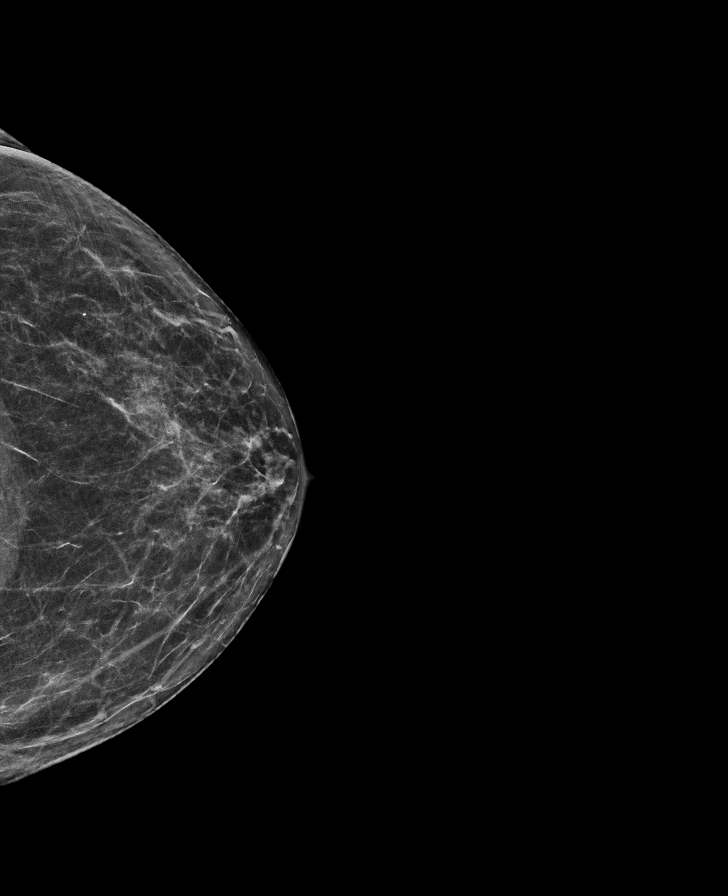

[R MLO synth-2D]
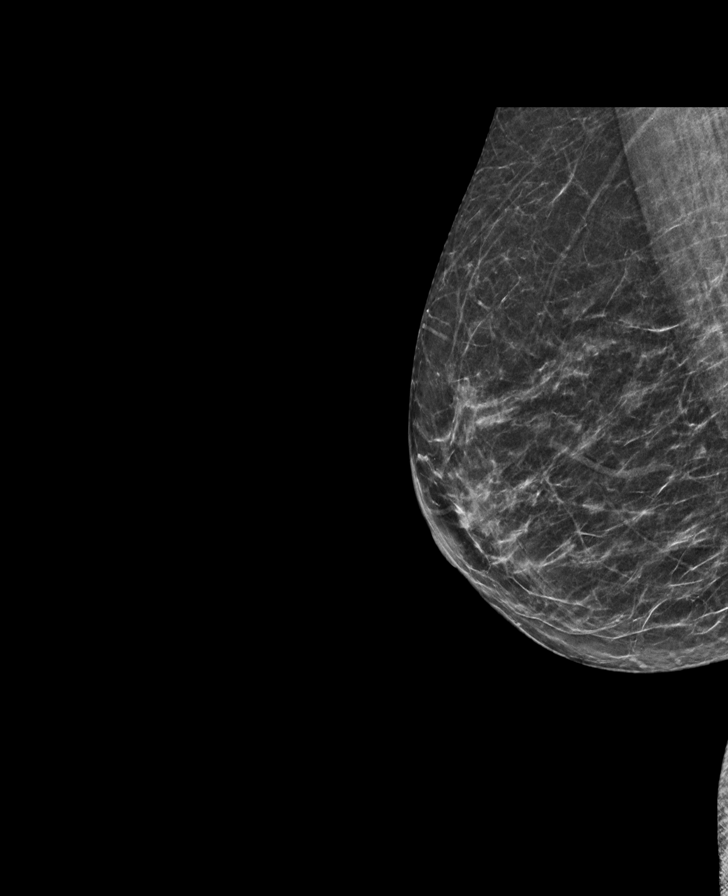

[L MLO tomo · tomo slice 29/58.0]
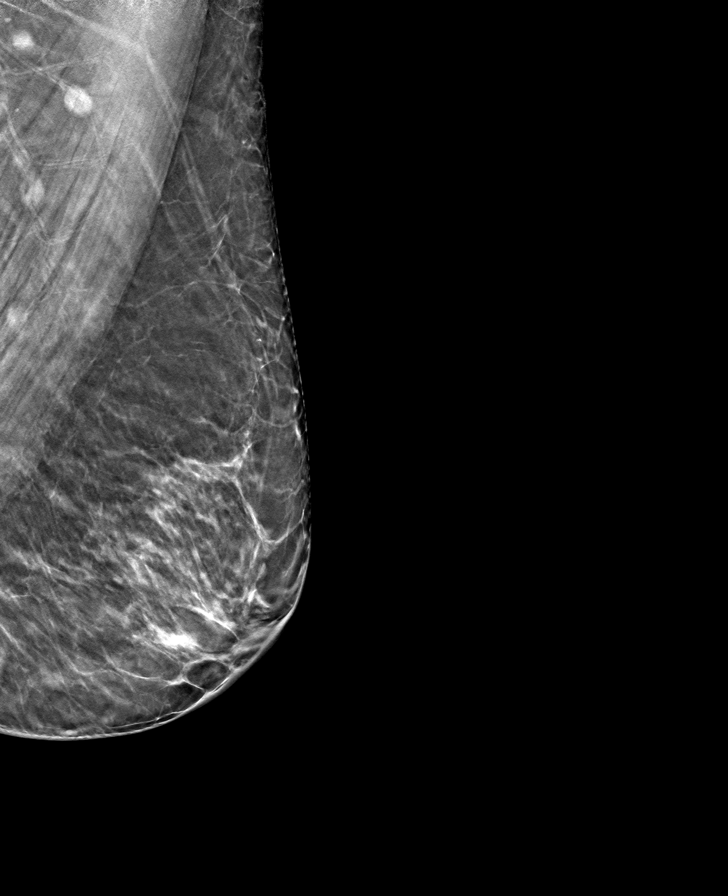

[R MLO tomo · tomo slice 29/56.0]
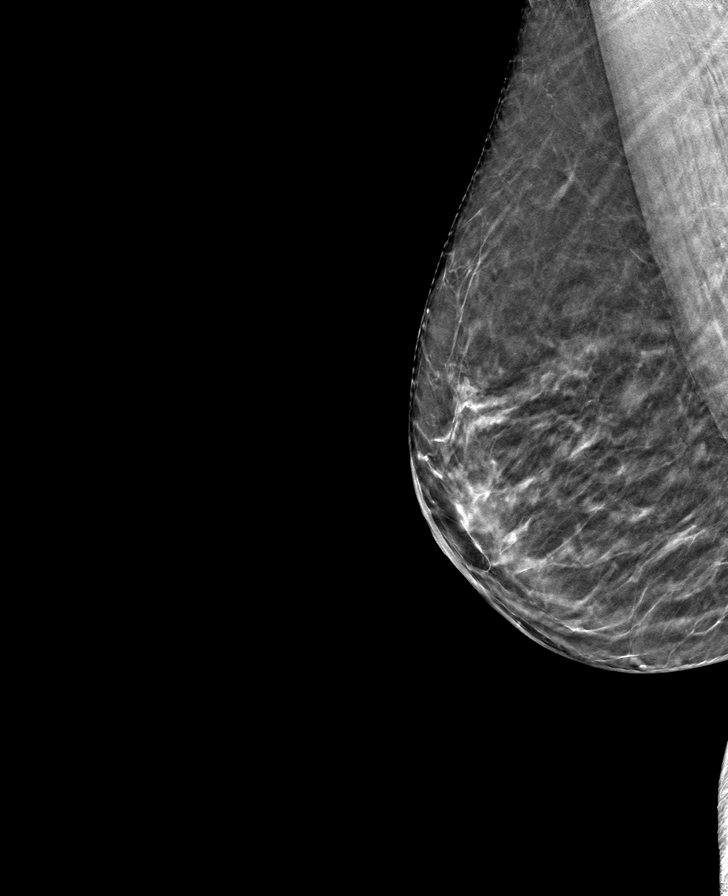

[R CC tomo · tomo slice 29/57.0]
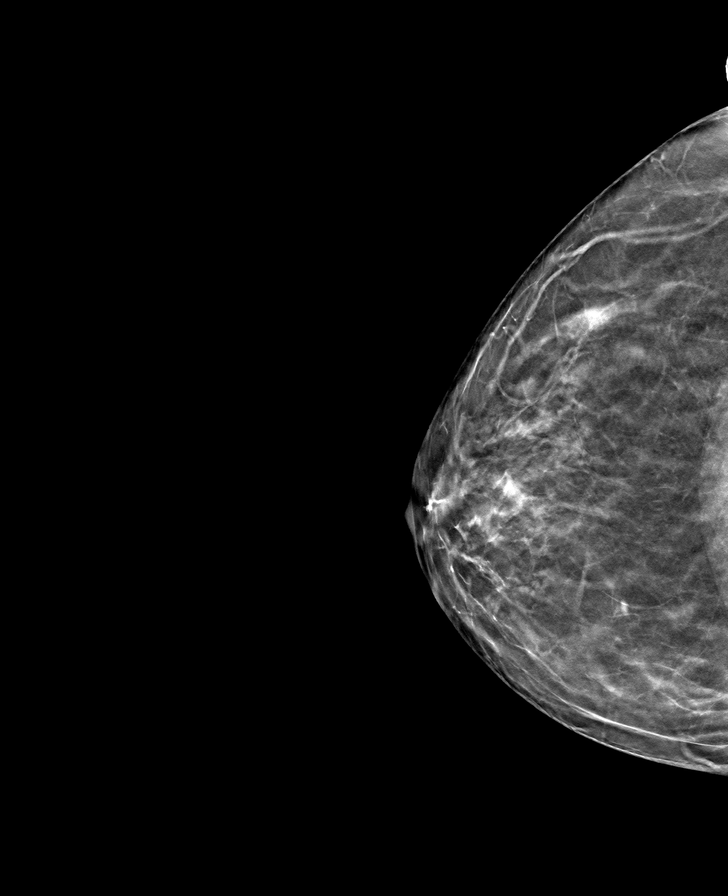

[L CC tomo · tomo slice 31/62.0]
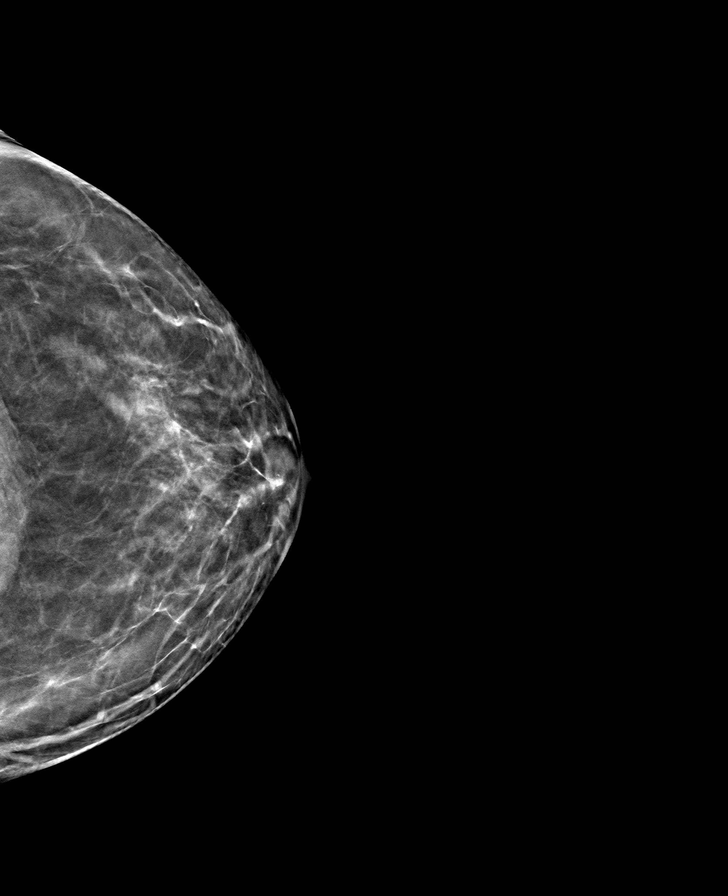

[8 of 24 positions shown; findings below may reference images not displayed]

ACR Breast Density Category b: There are scattered areas of
fibroglandular density.
FINDINGS: There are no findings suspicious for malignancy. Images were
processed with CAD.
IMPRESSION: No mammographic evidence of malignancy. A result letter of this
screening mammogram will be mailed directly to the patient.

RECOMMENDATION:
Screening mammogram in one year. (Code:CN-U-775)

BI-RADS CATEGORY  1: Negative.

## 2022-04-15 DIAGNOSIS — M810 Age-related osteoporosis without current pathological fracture: Secondary | ICD-10-CM | POA: Diagnosis not present

## 2022-04-15 DIAGNOSIS — Z1329 Encounter for screening for other suspected endocrine disorder: Secondary | ICD-10-CM | POA: Diagnosis not present

## 2022-04-15 DIAGNOSIS — M818 Other osteoporosis without current pathological fracture: Secondary | ICD-10-CM | POA: Diagnosis not present

## 2022-04-15 DIAGNOSIS — N952 Postmenopausal atrophic vaginitis: Secondary | ICD-10-CM | POA: Diagnosis not present

## 2022-04-15 DIAGNOSIS — E559 Vitamin D deficiency, unspecified: Secondary | ICD-10-CM | POA: Diagnosis not present

## 2022-07-07 DIAGNOSIS — D1801 Hemangioma of skin and subcutaneous tissue: Secondary | ICD-10-CM | POA: Diagnosis not present

## 2022-07-07 DIAGNOSIS — L304 Erythema intertrigo: Secondary | ICD-10-CM | POA: Diagnosis not present

## 2022-07-14 DIAGNOSIS — E559 Vitamin D deficiency, unspecified: Secondary | ICD-10-CM | POA: Diagnosis not present

## 2022-08-26 DIAGNOSIS — H2513 Age-related nuclear cataract, bilateral: Secondary | ICD-10-CM | POA: Diagnosis not present

## 2022-09-02 DIAGNOSIS — Z0100 Encounter for examination of eyes and vision without abnormal findings: Secondary | ICD-10-CM | POA: Diagnosis not present

## 2022-09-18 DIAGNOSIS — I10 Essential (primary) hypertension: Secondary | ICD-10-CM | POA: Diagnosis not present

## 2022-09-18 DIAGNOSIS — Z7189 Other specified counseling: Secondary | ICD-10-CM | POA: Diagnosis not present

## 2022-09-18 DIAGNOSIS — R69 Illness, unspecified: Secondary | ICD-10-CM | POA: Diagnosis not present

## 2022-09-18 DIAGNOSIS — E785 Hyperlipidemia, unspecified: Secondary | ICD-10-CM | POA: Diagnosis not present

## 2022-09-18 DIAGNOSIS — M810 Age-related osteoporosis without current pathological fracture: Secondary | ICD-10-CM | POA: Diagnosis not present

## 2022-09-25 ENCOUNTER — Other Ambulatory Visit: Payer: Self-pay | Admitting: Internal Medicine

## 2022-09-25 DIAGNOSIS — Z1231 Encounter for screening mammogram for malignant neoplasm of breast: Secondary | ICD-10-CM

## 2022-10-16 DIAGNOSIS — Z6824 Body mass index (BMI) 24.0-24.9, adult: Secondary | ICD-10-CM | POA: Diagnosis not present

## 2022-10-16 DIAGNOSIS — Z01419 Encounter for gynecological examination (general) (routine) without abnormal findings: Secondary | ICD-10-CM | POA: Diagnosis not present

## 2022-11-05 DIAGNOSIS — J209 Acute bronchitis, unspecified: Secondary | ICD-10-CM | POA: Diagnosis not present

## 2022-11-05 DIAGNOSIS — Z03818 Encounter for observation for suspected exposure to other biological agents ruled out: Secondary | ICD-10-CM | POA: Diagnosis not present

## 2022-11-05 DIAGNOSIS — B9689 Other specified bacterial agents as the cause of diseases classified elsewhere: Secondary | ICD-10-CM | POA: Diagnosis not present

## 2022-11-05 DIAGNOSIS — J019 Acute sinusitis, unspecified: Secondary | ICD-10-CM | POA: Diagnosis not present

## 2022-11-18 ENCOUNTER — Ambulatory Visit
Admission: RE | Admit: 2022-11-18 | Discharge: 2022-11-18 | Disposition: A | Discharge status: Home / Self Care | Payer: Medicare HMO | Source: Ambulatory Visit | Attending: Internal Medicine | Admitting: Internal Medicine

## 2022-11-18 DIAGNOSIS — Z1231 Encounter for screening mammogram for malignant neoplasm of breast: Secondary | ICD-10-CM | POA: Diagnosis not present

## 2023-03-20 DIAGNOSIS — E785 Hyperlipidemia, unspecified: Secondary | ICD-10-CM | POA: Diagnosis not present

## 2023-03-20 DIAGNOSIS — I10 Essential (primary) hypertension: Secondary | ICD-10-CM | POA: Diagnosis not present

## 2023-03-20 DIAGNOSIS — M810 Age-related osteoporosis without current pathological fracture: Secondary | ICD-10-CM | POA: Diagnosis not present

## 2023-03-20 DIAGNOSIS — R739 Hyperglycemia, unspecified: Secondary | ICD-10-CM | POA: Diagnosis not present

## 2023-03-27 DIAGNOSIS — Z1339 Encounter for screening examination for other mental health and behavioral disorders: Secondary | ICD-10-CM | POA: Diagnosis not present

## 2023-03-27 DIAGNOSIS — Z1331 Encounter for screening for depression: Secondary | ICD-10-CM | POA: Diagnosis not present

## 2023-03-27 DIAGNOSIS — F3340 Major depressive disorder, recurrent, in remission, unspecified: Secondary | ICD-10-CM | POA: Diagnosis not present

## 2023-03-27 DIAGNOSIS — I10 Essential (primary) hypertension: Secondary | ICD-10-CM | POA: Diagnosis not present

## 2023-03-27 DIAGNOSIS — N3946 Mixed incontinence: Secondary | ICD-10-CM | POA: Diagnosis not present

## 2023-03-27 DIAGNOSIS — Z6824 Body mass index (BMI) 24.0-24.9, adult: Secondary | ICD-10-CM | POA: Diagnosis not present

## 2023-03-27 DIAGNOSIS — E785 Hyperlipidemia, unspecified: Secondary | ICD-10-CM | POA: Diagnosis not present

## 2023-03-27 DIAGNOSIS — Z0000 Encounter for general adult medical examination without abnormal findings: Secondary | ICD-10-CM | POA: Diagnosis not present

## 2023-03-27 DIAGNOSIS — M810 Age-related osteoporosis without current pathological fracture: Secondary | ICD-10-CM | POA: Diagnosis not present

## 2023-03-27 DIAGNOSIS — H01009 Unspecified blepharitis unspecified eye, unspecified eyelid: Secondary | ICD-10-CM | POA: Diagnosis not present

## 2023-03-27 DIAGNOSIS — F419 Anxiety disorder, unspecified: Secondary | ICD-10-CM | POA: Diagnosis not present

## 2023-09-08 DIAGNOSIS — H2513 Age-related nuclear cataract, bilateral: Secondary | ICD-10-CM | POA: Diagnosis not present

## 2023-09-08 DIAGNOSIS — H5203 Hypermetropia, bilateral: Secondary | ICD-10-CM | POA: Diagnosis not present

## 2023-09-08 DIAGNOSIS — H52223 Regular astigmatism, bilateral: Secondary | ICD-10-CM | POA: Diagnosis not present

## 2023-09-08 DIAGNOSIS — H04123 Dry eye syndrome of bilateral lacrimal glands: Secondary | ICD-10-CM | POA: Diagnosis not present

## 2023-09-29 DIAGNOSIS — M810 Age-related osteoporosis without current pathological fracture: Secondary | ICD-10-CM | POA: Diagnosis not present

## 2023-09-29 DIAGNOSIS — F419 Anxiety disorder, unspecified: Secondary | ICD-10-CM | POA: Diagnosis not present

## 2023-09-29 DIAGNOSIS — I10 Essential (primary) hypertension: Secondary | ICD-10-CM | POA: Diagnosis not present

## 2023-09-29 DIAGNOSIS — E785 Hyperlipidemia, unspecified: Secondary | ICD-10-CM | POA: Diagnosis not present

## 2023-09-29 DIAGNOSIS — F3340 Major depressive disorder, recurrent, in remission, unspecified: Secondary | ICD-10-CM | POA: Diagnosis not present

## 2023-09-29 DIAGNOSIS — N3946 Mixed incontinence: Secondary | ICD-10-CM | POA: Diagnosis not present

## 2023-09-29 DIAGNOSIS — R002 Palpitations: Secondary | ICD-10-CM | POA: Diagnosis not present

## 2023-10-08 ENCOUNTER — Other Ambulatory Visit: Payer: Self-pay | Admitting: *Deleted

## 2023-10-08 ENCOUNTER — Ambulatory Visit: Payer: PPO | Attending: Internal Medicine

## 2023-10-08 DIAGNOSIS — R002 Palpitations: Secondary | ICD-10-CM

## 2023-10-08 NOTE — Progress Notes (Unsigned)
Enrolled for [employer] to mail a ZIO XT long term holter monitor to the patients address on file.   EP to read.

## 2023-10-20 ENCOUNTER — Other Ambulatory Visit: Payer: Self-pay | Admitting: *Deleted

## 2023-10-20 ENCOUNTER — Ambulatory Visit: Payer: PPO

## 2023-10-20 DIAGNOSIS — R002 Palpitations: Secondary | ICD-10-CM

## 2023-10-20 NOTE — Progress Notes (Unsigned)
Enrolled for [employer] to mail a ZIO XT long term holter monitor to the patients address on file.   EP to read.

## 2023-10-22 DIAGNOSIS — R002 Palpitations: Secondary | ICD-10-CM | POA: Diagnosis not present

## 2023-11-12 DIAGNOSIS — R002 Palpitations: Secondary | ICD-10-CM | POA: Diagnosis not present

## 2023-12-29 DIAGNOSIS — Z6825 Body mass index (BMI) 25.0-25.9, adult: Secondary | ICD-10-CM | POA: Diagnosis not present

## 2023-12-29 DIAGNOSIS — N958 Other specified menopausal and perimenopausal disorders: Secondary | ICD-10-CM | POA: Diagnosis not present

## 2023-12-29 DIAGNOSIS — M8588 Other specified disorders of bone density and structure, other site: Secondary | ICD-10-CM | POA: Diagnosis not present

## 2023-12-29 DIAGNOSIS — Z124 Encounter for screening for malignant neoplasm of cervix: Secondary | ICD-10-CM | POA: Diagnosis not present

## 2023-12-29 DIAGNOSIS — Z1151 Encounter for screening for human papillomavirus (HPV): Secondary | ICD-10-CM | POA: Diagnosis not present

## 2023-12-31 ENCOUNTER — Other Ambulatory Visit: Payer: Self-pay | Admitting: Internal Medicine

## 2023-12-31 DIAGNOSIS — Z0000 Encounter for general adult medical examination without abnormal findings: Secondary | ICD-10-CM

## 2024-01-12 ENCOUNTER — Ambulatory Visit
Admission: RE | Admit: 2024-01-12 | Discharge: 2024-01-12 | Disposition: A | Discharge status: Home / Self Care | Payer: HMO | Source: Ambulatory Visit | Attending: Internal Medicine | Admitting: Internal Medicine

## 2024-01-12 DIAGNOSIS — Z1231 Encounter for screening mammogram for malignant neoplasm of breast: Secondary | ICD-10-CM | POA: Diagnosis not present

## 2024-01-12 DIAGNOSIS — Z0000 Encounter for general adult medical examination without abnormal findings: Secondary | ICD-10-CM

## 2024-04-12 DIAGNOSIS — E785 Hyperlipidemia, unspecified: Secondary | ICD-10-CM | POA: Diagnosis not present

## 2024-04-12 DIAGNOSIS — M810 Age-related osteoporosis without current pathological fracture: Secondary | ICD-10-CM | POA: Diagnosis not present

## 2024-04-12 DIAGNOSIS — I10 Essential (primary) hypertension: Secondary | ICD-10-CM | POA: Diagnosis not present

## 2024-04-19 DIAGNOSIS — M7062 Trochanteric bursitis, left hip: Secondary | ICD-10-CM | POA: Diagnosis not present

## 2024-04-19 DIAGNOSIS — Z1331 Encounter for screening for depression: Secondary | ICD-10-CM | POA: Diagnosis not present

## 2024-04-19 DIAGNOSIS — M810 Age-related osteoporosis without current pathological fracture: Secondary | ICD-10-CM | POA: Diagnosis not present

## 2024-04-19 DIAGNOSIS — M7061 Trochanteric bursitis, right hip: Secondary | ICD-10-CM | POA: Diagnosis not present

## 2024-04-19 DIAGNOSIS — F331 Major depressive disorder, recurrent, moderate: Secondary | ICD-10-CM | POA: Diagnosis not present

## 2024-04-19 DIAGNOSIS — Z1339 Encounter for screening examination for other mental health and behavioral disorders: Secondary | ICD-10-CM | POA: Diagnosis not present

## 2024-04-19 DIAGNOSIS — Z0000 Encounter for general adult medical examination without abnormal findings: Secondary | ICD-10-CM | POA: Diagnosis not present

## 2024-04-19 DIAGNOSIS — E785 Hyperlipidemia, unspecified: Secondary | ICD-10-CM | POA: Diagnosis not present

## 2024-04-19 DIAGNOSIS — I10 Essential (primary) hypertension: Secondary | ICD-10-CM | POA: Diagnosis not present

## 2024-04-19 DIAGNOSIS — Z23 Encounter for immunization: Secondary | ICD-10-CM | POA: Diagnosis not present

## 2024-04-19 DIAGNOSIS — I471 Supraventricular tachycardia, unspecified: Secondary | ICD-10-CM | POA: Diagnosis not present

## 2024-04-19 DIAGNOSIS — F419 Anxiety disorder, unspecified: Secondary | ICD-10-CM | POA: Diagnosis not present

## 2024-05-24 DIAGNOSIS — N390 Urinary tract infection, site not specified: Secondary | ICD-10-CM | POA: Diagnosis not present

## 2024-05-24 DIAGNOSIS — Z87440 Personal history of urinary (tract) infections: Secondary | ICD-10-CM | POA: Diagnosis not present

## 2024-05-24 DIAGNOSIS — R350 Frequency of micturition: Secondary | ICD-10-CM | POA: Diagnosis not present

## 2024-10-06 DIAGNOSIS — I10 Essential (primary) hypertension: Secondary | ICD-10-CM | POA: Diagnosis not present

## 2024-10-06 DIAGNOSIS — Z79899 Other long term (current) drug therapy: Secondary | ICD-10-CM | POA: Diagnosis not present

## 2024-10-06 DIAGNOSIS — E785 Hyperlipidemia, unspecified: Secondary | ICD-10-CM | POA: Diagnosis not present

## 2024-10-14 NOTE — Progress Notes (Unsigned)
 [facility]:    Date:  10/17/2024   ID:  Sherry Monroe, Sherry Monroe 02/05/1951, MRN [id number]  PCP:  Stephane Leita DEL, MD  Cardiologist:  New - Dr. Wonda (DOD today).  Referring MD: Stephane Leita DEL, MD   Chief Complaint: chest pain  History of Present Illness:    Sherry Monroe is a 73 y.o. female with a history of palpitations with shot runs of SVT and rare PAC/ PVCs noted on monitor in 112024, hypertension, hyperlipidemia, IBS, and anxiety/ depression who presents today as a new patient in the [facility] for further evaluation of chest pain.   Patient has a history of palpitations. PCP ordered a Zio monitor in 10/2023 which showed 2 short runs of SVT (longest run 12 beats) and rare PACs/ PVCs but no significant arrhythmias.   Patient was recently seen by PCP on 10/06/2024 at which time she reported chest pain. She was referred to Cardiology for further evaluation.   She states she periodically gets a strange sensation that feels like her esophagus is closing up.  She ranks this as a 7/10 on the pain scale and states it sometimes radiates up to her neck.  The pain lasts for about a minute and then resolves on its own.  It occurs mostly at rest and is not worse with exertion.  Pain occurs very infrequently about once every 2-3 months.  Last episode was about 3 weeks ago.  She reports occasional mild lower extremity edema but nothing significant.  No other cardiac symptoms.  She has a history of hypertension and hyperlipidemia but no history of diabetes.  She has no known history of CAD and has never had a stroke.  She denies any tobacco use. She reports drinking a couple glasses of wine per week but denies any drug use.  Her mother had a history of a stroke in her mid 45s but she denies any other family history of cardiovascular disease.  ROS: No shortness of breath, orthopnea, PND, lower extremity edema, palpitations, lightheadedness, dizziness, syncope.     Recent Labs from [employer] [facility] [employer] [facility] on 10/06/2024: - CMET: Na 138, K 5.1, Glucose 111, BUN 12, Cr 0.4, Albumin 4.4, AST 27, ALT 28, Alk Phos 82, Total Bili 0.8.  - Lipid panel: Total Cholesterol 204, Triglycerides 81, HDL 78, LDL 110   EKGs/Labs/Other Studies Reviewed:    The following studies were reviewed:  Zio Monitor 10/22/2023 to 11/05/2023: HR 51 - 158, average 73 bpm. 2 nonsustained SVT, longest 12 beats. Rare supraventricular and ventricular ectopy. No sustained arrhythmias. No atrial fibrillation.  EKG:  EKG not ordered today.  Recent EKG from PCPs office on 10/07/2024 showed sinus bradycardia, rate 59 bpm, with no acute ischemic changes.  Recent Labs: No results found for requested labs within last 365 days.  Recent Lipid Panel No results found for: CHOL, TRIG, HDL, CHOLHDL, VLDL, LDLCALC, LDLDIRECT  Physical Exam:    Vital Signs: BP (!) 162/70   Pulse (!) 56   Ht 5' 7 (1.702 m)   Wt 165 lb 8 oz (75.1 kg)   SpO2 98%   BMI 25.92 kg/m     Wt Readings from Last 3 Encounters:  10/17/24 165 lb 8 oz (75.1 kg)  05/23/20 147 lb (66.7 kg)  05/09/20 147 lb (66.7 kg)     General: 74 y.o. female in no acute distress. HEENT: Normocephalic and atraumatic. Sclera clear.  Neck: Supple. No carotid bruits. No JVD. Heart: RRR. Distinct S1  and S2. No murmurs, gallops, or rubs.  Lungs: No increased work of breathing. Clear to ausculation bilaterally. No wheezes, rhonchi, or rales.  Extremities: No lower extremity edema.   Skin: Warm and dry. Neuro: No focal deficits. Psych: Normal affect. Responds appropriately.  Assessment:    1. Precordial pain   2. Primary hypertension   3. Hyperlipidemia, unspecified hyperlipidemia type     Plan:    Chest Pain Patient reports rare episodes of atypical chest pain that she describes as a feeling of her esophagus closing up.  This only occurs about once every 2-3 months and lasts for 1 minute and then resolves on its  own.  Recent EKG at PCPs office on 10/06/2024 showed sinus bradycardia rate 59 beats minute with no acute ischemic changes. - Chest pain sounds atypical but will order coronary CTA to rule out obstructive CAD.  Heart rates are in the 50s so she does not need any Lopressor prior to CTA.  Recent be met at PCPs office earlier this month showed creatinine of 0.4.   Hypertension BP elevated in the office today.  Initially 152/90 and then 162/70 on my personal recheck at the end of visit.  However, patient states BP is normally well-controlled.  BP was 130/70 at recent PCP visit. - Continue current medication: Amlodipine 5mg  daily and  Lisinopril 50mg  daily. - Asked patient to monitor BP at home and let us  know if it is consistently >130/80.  Hyperlipidemia Lipid panel on 10/06/2024: Total Cholesterol 204, Triglycerides 81, HDL 78, LDL 110. - Continue Lipitor 10mg  daily and Zetia 10mg  daily.   - May need to adjust medications depending on coronary CTA results.   Disposition: Follow up in 2-3 months.   Signed, Musab Wingard E Anny Sayler, PA-C  10/17/2024 11:15 AM    [facility]

## 2024-10-17 ENCOUNTER — Encounter: Payer: Self-pay | Admitting: Student

## 2024-10-17 ENCOUNTER — Ambulatory Visit: Attending: Student | Admitting: Student

## 2024-10-17 VITALS — BP 162/70 | HR 56 | Ht 67.0 in | Wt 165.5 lb

## 2024-10-17 DIAGNOSIS — I10 Essential (primary) hypertension: Secondary | ICD-10-CM

## 2024-10-17 DIAGNOSIS — R072 Precordial pain: Secondary | ICD-10-CM

## 2024-10-17 DIAGNOSIS — E785 Hyperlipidemia, unspecified: Secondary | ICD-10-CM | POA: Diagnosis not present

## 2024-10-17 NOTE — Patient Instructions (Addendum)
 Medication Instructions:  Your physician recommends that you continue on your current medications as directed. Please refer to the Current Medication list given to you today.  *If you need a refill on your cardiac medications before your next appointment, please call your pharmacy*  Lab Work: None ordered  If you have labs (blood work) drawn today and your tests are completely normal, you will receive your results only by: MyChart Message (if you have MyChart) OR A paper copy in the mail If you have any lab test that is abnormal or we need to change your treatment, we will call you to review the results.  Testing/Procedures: Your physician has requested that you have cardiac CT. Cardiac computed tomography (CT) is a painless test that uses an x-ray machine to take clear, detailed pictures of your heart. For further information please visit [email or web address] Please follow instruction sheet BELOW:    Your cardiac CT will be scheduled at one of the below locations:   [facility] [address] [place], [place] [address] [phone] (Severe contrast allergies only)  OR   [facility] [address] [place], [place] [address] [phone]  OR   [facility] [address] [place], [place] [address] [phone]  OR   [facility] and V[facility] Tower [address]  [place], [place] [address]  OR   [facility] [address] [place], [place] [phone]  If scheduled at [facility], please arrive at the [facility] and Children's Entrance (Entrance C2) of [facility] 30 minutes prior to test start time. You can use the FREE valet parking offered at entrance C (encouraged to control the heart rate for the test)  Proceed to the [facility] Radiology Department (first floor) to check-in and test prep.  All radiology patients and guests should use entrance C2 at [facility], accessed  from [facility], even though the hospital's physical address listed is [address]  If scheduled at the Heart and Vascular Tower at [address], please enter the parking lot using the [address] entrance and use the FREE valet service at the patient drop-off area. Enter the building and check-in with registration on the main floor.  If scheduled at [facility], please arrive to the Heart and Vascular Center 15 mins early for check-in and test prep.  There is spacious parking and easy access to the radiology department from the [facility] Heart and Vascular entrance. Please enter here and check-in with the desk attendant.   If scheduled at [facility], please arrive 30 minutes early for check-in and test prep.  Please follow these instructions carefully (unless otherwise directed):  An IV will be required for this test and Nitroglycerin will be given.    On the Night Before the Test: Be sure to Drink plenty of water. Do not consume any caffeinated/decaffeinated beverages or chocolate 12 hours prior to your test. Do not take any antihistamines 12 hours prior to your test.   On the Day of the Test: Drink plenty of water until 1 hour prior to the test. Do not eat any food 1 hour prior to test. You may take your regular medications prior to the test.  FEMALES- please wear underwire-free bra if available, avoid dresses & tight clothing   After the Test: Drink plenty of water. After receiving IV contrast, you may experience a mild flushed feeling. This is normal. On occasion, you  may experience a mild rash up to 24 hours after the test. This is not dangerous. If this occurs, you can take Benadryl 25 mg, Zyrtec, Claritin, or Allegra and increase your fluid intake. (Patients taking Tikosyn should avoid Benadryl, and may take Zyrtec, Claritin, or Allegra) If you experience trouble breathing, this can be serious. If it is severe call 911  IMMEDIATELY. If it is mild, please call our office.  We will call to schedule your test 2-4 weeks out understanding that some insurance companies will need an authorization prior to the service being performed.   For more information and frequently asked questions, please visit our website : [email or web address]  For non-scheduling related questions, please contact the cardiac imaging nurse navigator should you have any questions/concerns: Cardiac Imaging Nurse Navigators Direct Office Dial: [phone]   For scheduling needs, including cancellations and rescheduling, please call Brittany, [phone].   Follow-Up: At [facility], you and your health needs are our priority.  As part of our continuing mission to provide you with exceptional heart care, our providers are all part of one team.  This team includes your primary Cardiologist (physician) and Advanced Practice Providers or APPs (Physician Assistants and Nurse Practitioners) who all work together to provide you with the care you need, when you need it.  Your next appointment:   2 month(s)  Provider:   Callie Goodrich, PA-C          We recommend signing up for the patient portal called MyChart.  Sign up information is provided on this After Visit Summary.  MyChart is used to connect with patients for Virtual Visits (Telemedicine).  Patients are able to view lab/test results, encounter notes, upcoming appointments, etc.  Non-urgent messages can be sent to your provider as well.   To learn more about what you can do with MyChart, go to [email or web address].   Other Instructions

## 2024-10-23 DIAGNOSIS — Z03818 Encounter for observation for suspected exposure to other biological agents ruled out: Secondary | ICD-10-CM | POA: Diagnosis not present

## 2024-10-23 DIAGNOSIS — R051 Acute cough: Secondary | ICD-10-CM | POA: Diagnosis not present

## 2024-11-08 DIAGNOSIS — H2513 Age-related nuclear cataract, bilateral: Secondary | ICD-10-CM | POA: Diagnosis not present

## 2024-11-08 DIAGNOSIS — H35363 Drusen (degenerative) of macula, bilateral: Secondary | ICD-10-CM | POA: Diagnosis not present

## 2024-12-04 NOTE — Progress Notes (Unsigned)
" ° °  Cardiology Office Note:    Date:  12/04/2024   ID:  Sherry Monroe, Sherry Monroe 24, 1952, MRN [id number]  PCP:  Stephane Leita DEL, MD  Cardiologist:  None { Click to update primary MD,subspecialty MD or APP then REFRESH:1}    Referring MD: Stephane Leita DEL, MD   Chief Complaint: follow-up of chest pain  History of Present Illness:    Sherry Monroe is a 74 y.o. female with a history of palpitations with shot runs of SVT and rare PAC/ PVCs noted on monitor in 10/2023, hypertension, hyperlipidemia, IBS, and anxiety/ depression who presents today for follow-up of chest pain.   Patient has a history of palpitations. PCP ordered a Zio monitor in 10/2023 which showed 2 short runs of SVT (longest run 12 beats) and rare PACs/ PVCs but no significant arrhythmias. She was recently referred to Cardiology and seen by me in the [facility] in 10/2024 for further evaluation of chest pain. She described this pain as a strange sensation that felt like her esophagus was closing up. Chest pain sounded atypical; however, a coronary CTA was ordered for further evaluation. This has not been done yet.   Patient presents today for follow-up. ***  Chest Pain Patient reported atypical chest pain at last visit in 10/2024 and a coronary CTA was ordered for further evaluation. *** - ***  Hypertension BP*** - Continue current medication: Amlodipine 5mg  daily and  Losartan 50mg  daily.    Hyperlipidemia Lipid panel in 10/2024: Total Cholesterol 204, Triglycerides 81, HDL 78, LDL 110. - Continue Lipitor 10mg  daily and Zetia 10mg  daily.  - ***  EKGs/Labs/Other Studies Reviewed:    The following studies were reviewed:  Zio Monitor 10/22/2023 to 11/05/2023: HR 51 - 158, average 73 bpm. 2 nonsustained SVT, longest 12 beats. Rare supraventricular and ventricular ectopy. No sustained arrhythmias. No atrial fibrillation.  EKG:  EKG not ordered today.   Recent Labs: No results found for requested  labs within last 365 days.  Recent Lipid Panel No results found for: CHOL, TRIG, HDL, CHOLHDL, VLDL, LDLCALC, LDLDIRECT  Physical Exam:    Vital Signs: There were no vitals taken for this visit.    Wt Readings from Last 3 Encounters:  10/17/24 165 lb 8 oz (75.1 kg)  05/23/20 147 lb (66.7 kg)  05/09/20 147 lb (66.7 kg)     General: 74 y.o. female in no acute distress. HEENT: Normocephalic and atraumatic. Sclera clear.  Neck: Supple. No carotid bruits. No JVD. Heart: *** RRR. Distinct S1 and S2. No murmurs, gallops, or rubs.  Lungs: No increased work of breathing. Clear to ausculation bilaterally. No wheezes, rhonchi, or rales.  Abdomen: Soft, non-distended, and non-tender to palpation.  Extremities: No lower extremity edema.  Radial and distal pedal pulses 2+ and equal bilaterally. Skin: Warm and dry. Neuro: No focal deficits. Psych: Normal affect. Responds appropriately.   Assessment:    No diagnosis found.  Plan:     Disposition: Follow up in ***   Signed, Donyelle Enyeart E Dollie Bressi, PA-C  12/04/2024 3:07 PM    [facility] "

## 2024-12-07 NOTE — Progress Notes (Signed)
 Sherry Monroe                                          MRN: [id number]   12/07/2024   The VBCI Quality Team Specialist reviewed this patient medical record for the purposes of chart review for care gap closure. The following were reviewed: abstraction for care gap closure-controlling blood pressure.    VBCI Quality Team

## 2024-12-08 ENCOUNTER — Telehealth: Payer: Self-pay | Admitting: Student

## 2024-12-08 DIAGNOSIS — R072 Precordial pain: Secondary | ICD-10-CM

## 2024-12-08 NOTE — Telephone Encounter (Signed)
 I am not sure why the coronary CTA was cancelled. Yes, I would recommend we reschedule the coronary CTA and then push her follow-up visit until after this.  Thank you!

## 2024-12-08 NOTE — Telephone Encounter (Signed)
 Addressed in [place] message and instructions sent

## 2024-12-08 NOTE — Telephone Encounter (Signed)
 Patient wants to know if she will need any special preparations for her visit on 1/12.

## 2024-12-09 NOTE — Addendum Note (Signed)
 Addended by: JOSHUA ANDREZ PARAS on: 12/09/2024 10:11 AM   Modules accepted: Orders

## 2024-12-12 ENCOUNTER — Ambulatory Visit: Admitting: Student

## 2024-12-21 ENCOUNTER — Encounter (HOSPITAL_COMMUNITY): Payer: Self-pay

## 2024-12-23 ENCOUNTER — Ambulatory Visit (HOSPITAL_COMMUNITY)
Admission: RE | Admit: 2024-12-23 | Discharge: 2024-12-23 | Disposition: A | Source: Ambulatory Visit | Attending: Student | Admitting: Student

## 2024-12-23 DIAGNOSIS — R072 Precordial pain: Secondary | ICD-10-CM | POA: Insufficient documentation

## 2024-12-23 MED ORDER — IOHEXOL 350 MG/ML SOLN
100.0000 mL | Freq: Once | INTRAVENOUS | Status: AC | PRN
  Administered 2024-12-23: 100 mL via INTRAVENOUS

## 2024-12-23 MED ORDER — NITROGLYCERIN 0.4 MG SL SUBL
0.8000 mg | SUBLINGUAL_TABLET | Freq: Once | SUBLINGUAL | Status: AC
  Administered 2024-12-23: 0.8 mg via SUBLINGUAL

## 2024-12-27 ENCOUNTER — Ambulatory Visit: Payer: Self-pay | Admitting: Student

## 2024-12-29 ENCOUNTER — Telehealth: Payer: Self-pay

## 2024-12-29 NOTE — Telephone Encounter (Signed)
 Hey Anitra! I was going to increase patient's Lipitor to 40mg  daily to get LDL <100 after coronary CTA results. However, patient wanted us  to run this by her PCP first. It sounds like her PCP has tried different dosages of statins before. Can you please reach out to her PCP to see what she has tried before and if she is okay with increasing the dose of Lipitor (sorry her PCP is at [facility] so I cannot send her a staff message directly? If we can't increase dose of Lipitor, other options include trying a different statin (like Crestor) or switching from Zetia to Nexlizet.   Thanks so much! Soraida Vickers

## 2025-01-23 ENCOUNTER — Ambulatory Visit: Admitting: Student
